# Patient Record
Sex: Male | Born: 1980 | Race: White | Hispanic: No | Marital: Single | Smoking: Current every day smoker
Health system: Southern US, Community
[De-identification: ages and names within clinical notes are randomized; demographics above are authoritative.]

---

## 2001-04-23 ENCOUNTER — Emergency Department (HOSPITAL_COMMUNITY): Admission: EM | Admit: 2001-04-23 | Discharge: 2001-04-23 | Payer: Self-pay | Admitting: Emergency Medicine

## 2001-04-23 ENCOUNTER — Encounter: Payer: Self-pay | Admitting: Emergency Medicine

## 2010-04-26 ENCOUNTER — Emergency Department (HOSPITAL_COMMUNITY): Admission: EM | Admit: 2010-04-26 | Discharge: 2010-04-26 | Payer: Self-pay | Admitting: Emergency Medicine

## 2017-03-27 ENCOUNTER — Emergency Department (HOSPITAL_COMMUNITY): Payer: Self-pay

## 2017-03-27 ENCOUNTER — Encounter (HOSPITAL_COMMUNITY): Payer: Self-pay | Admitting: Emergency Medicine

## 2017-03-27 ENCOUNTER — Inpatient Hospital Stay (HOSPITAL_COMMUNITY)
Admission: EM | Admit: 2017-03-27 | Discharge: 2017-04-15 | DRG: 082 | Disposition: E | Payer: Self-pay | Attending: General Surgery | Admitting: General Surgery

## 2017-03-27 DIAGNOSIS — Z9911 Dependence on respirator [ventilator] status: Secondary | ICD-10-CM

## 2017-03-27 DIAGNOSIS — Z4659 Encounter for fitting and adjustment of other gastrointestinal appliance and device: Secondary | ICD-10-CM

## 2017-03-27 DIAGNOSIS — E876 Hypokalemia: Secondary | ICD-10-CM | POA: Diagnosis present

## 2017-03-27 DIAGNOSIS — S0193XA Puncture wound without foreign body of unspecified part of head, initial encounter: Secondary | ICD-10-CM

## 2017-03-27 DIAGNOSIS — Z66 Do not resuscitate: Secondary | ICD-10-CM | POA: Diagnosis present

## 2017-03-27 DIAGNOSIS — F17200 Nicotine dependence, unspecified, uncomplicated: Secondary | ICD-10-CM | POA: Diagnosis present

## 2017-03-27 DIAGNOSIS — Z23 Encounter for immunization: Secondary | ICD-10-CM

## 2017-03-27 DIAGNOSIS — I959 Hypotension, unspecified: Secondary | ICD-10-CM | POA: Diagnosis present

## 2017-03-27 DIAGNOSIS — T426X5A Adverse effect of other antiepileptic and sedative-hypnotic drugs, initial encounter: Secondary | ICD-10-CM | POA: Diagnosis present

## 2017-03-27 DIAGNOSIS — E870 Hyperosmolality and hypernatremia: Secondary | ICD-10-CM | POA: Diagnosis present

## 2017-03-27 DIAGNOSIS — J189 Pneumonia, unspecified organism: Secondary | ICD-10-CM

## 2017-03-27 DIAGNOSIS — S069X9A Unspecified intracranial injury with loss of consciousness of unspecified duration, initial encounter: Secondary | ICD-10-CM

## 2017-03-27 DIAGNOSIS — Z515 Encounter for palliative care: Secondary | ICD-10-CM | POA: Diagnosis present

## 2017-03-27 DIAGNOSIS — W3400XA Accidental discharge from unspecified firearms or gun, initial encounter: Secondary | ICD-10-CM

## 2017-03-27 DIAGNOSIS — R40243 Glasgow coma scale score 3-8, unspecified time: Secondary | ICD-10-CM | POA: Diagnosis present

## 2017-03-27 DIAGNOSIS — R000 Tachycardia, unspecified: Secondary | ICD-10-CM | POA: Diagnosis present

## 2017-03-27 DIAGNOSIS — N179 Acute kidney failure, unspecified: Secondary | ICD-10-CM | POA: Diagnosis not present

## 2017-03-27 DIAGNOSIS — J9601 Acute respiratory failure with hypoxia: Secondary | ICD-10-CM | POA: Diagnosis present

## 2017-03-27 DIAGNOSIS — J9690 Respiratory failure, unspecified, unspecified whether with hypoxia or hypercapnia: Secondary | ICD-10-CM

## 2017-03-27 DIAGNOSIS — D62 Acute posthemorrhagic anemia: Secondary | ICD-10-CM | POA: Diagnosis present

## 2017-03-27 DIAGNOSIS — E232 Diabetes insipidus: Secondary | ICD-10-CM | POA: Diagnosis present

## 2017-03-27 DIAGNOSIS — R0902 Hypoxemia: Secondary | ICD-10-CM

## 2017-03-27 DIAGNOSIS — J9811 Atelectasis: Secondary | ICD-10-CM | POA: Diagnosis present

## 2017-03-27 DIAGNOSIS — R739 Hyperglycemia, unspecified: Secondary | ICD-10-CM | POA: Diagnosis present

## 2017-03-27 DIAGNOSIS — G936 Cerebral edema: Secondary | ICD-10-CM | POA: Diagnosis present

## 2017-03-27 DIAGNOSIS — Z638 Other specified problems related to primary support group: Secondary | ICD-10-CM

## 2017-03-27 DIAGNOSIS — J398 Other specified diseases of upper respiratory tract: Secondary | ICD-10-CM

## 2017-03-27 DIAGNOSIS — S0292XA Unspecified fracture of facial bones, initial encounter for closed fracture: Secondary | ICD-10-CM | POA: Diagnosis present

## 2017-03-27 DIAGNOSIS — Y249XXA Unspecified firearm discharge, undetermined intent, initial encounter: Secondary | ICD-10-CM

## 2017-03-27 DIAGNOSIS — R001 Bradycardia, unspecified: Secondary | ICD-10-CM | POA: Diagnosis present

## 2017-03-27 DIAGNOSIS — Z880 Allergy status to penicillin: Secondary | ICD-10-CM

## 2017-03-27 DIAGNOSIS — X749XXA Intentional self-harm by unspecified firearm discharge, initial encounter: Secondary | ICD-10-CM

## 2017-03-27 DIAGNOSIS — Z452 Encounter for adjustment and management of vascular access device: Secondary | ICD-10-CM

## 2017-03-27 DIAGNOSIS — R609 Edema, unspecified: Secondary | ICD-10-CM | POA: Diagnosis present

## 2017-03-27 LAB — BPAM RBC
BLOOD PRODUCT EXPIRATION DATE: 201810182359
Blood Product Expiration Date: 201810182359
ISSUE DATE / TIME: 201810130055
ISSUE DATE / TIME: 201810130829
Unit Type and Rh: 9500
Unit Type and Rh: 9500

## 2017-03-27 LAB — BPAM FFP
BLOOD PRODUCT EXPIRATION DATE: 201810272359
BLOOD PRODUCT EXPIRATION DATE: 201810272359
ISSUE DATE / TIME: 201810130055
ISSUE DATE / TIME: 201810130055
Unit Type and Rh: 6200
Unit Type and Rh: 6200

## 2017-03-27 LAB — COMPREHENSIVE METABOLIC PANEL
ALT: 21 U/L (ref 17–63)
AST: 36 U/L (ref 15–41)
Albumin: 3 g/dL — ABNORMAL LOW (ref 3.5–5.0)
Alkaline Phosphatase: 48 U/L (ref 38–126)
Anion gap: 10 (ref 5–15)
BUN: 19 mg/dL (ref 6–20)
CHLORIDE: 108 mmol/L (ref 101–111)
CO2: 19 mmol/L — AB (ref 22–32)
CREATININE: 1.47 mg/dL — AB (ref 0.61–1.24)
Calcium: 8.2 mg/dL — ABNORMAL LOW (ref 8.9–10.3)
GFR calc Af Amer: >60 mL/min (ref >60)
GFR, EST NON AFRICAN AMERICAN: 59 mL/min — AB (ref >60)
GLUCOSE: 250 mg/dL — AB (ref 65–99)
Potassium: 3.6 mmol/L (ref 3.5–5.1)
SODIUM: 137 mmol/L (ref 135–145)
Total Bilirubin: 0.5 mg/dL (ref 0.3–1.2)
Total Protein: 5.2 g/dL — ABNORMAL LOW (ref 6.5–8.1)

## 2017-03-27 LAB — URINALYSIS, ROUTINE W REFLEX MICROSCOPIC
Bacteria, UA: NONE SEEN
Bilirubin Urine: NEGATIVE
GLUCOSE, UA: 150 mg/dL — AB
HGB URINE DIPSTICK: NEGATIVE
Ketones, ur: NEGATIVE mg/dL
Leukocytes, UA: NEGATIVE
NITRITE: NEGATIVE
PH: 6 (ref 5.0–8.0)
PROTEIN: NEGATIVE mg/dL
RBC / HPF: NONE SEEN RBC/hpf (ref 0–5)
SPECIFIC GRAVITY, URINE: 1.009 (ref 1.005–1.030)
Squamous Epithelial / LPF: NONE SEEN

## 2017-03-27 LAB — PREPARE FRESH FROZEN PLASMA
UNIT DIVISION: 0
Unit division: 0

## 2017-03-27 LAB — I-STAT CHEM 8, ED
BUN: 21 mg/dL — ABNORMAL HIGH (ref 6–20)
Calcium, Ion: 1 mmol/L — ABNORMAL LOW (ref 1.15–1.40)
Chloride: 107 mmol/L (ref 101–111)
Creatinine, Ser: 1.3 mg/dL — ABNORMAL HIGH (ref 0.61–1.24)
Glucose, Bld: 244 mg/dL — ABNORMAL HIGH (ref 65–99)
HEMATOCRIT: 38 % — AB (ref 39.0–52.0)
HEMOGLOBIN: 12.9 g/dL — AB (ref 13.0–17.0)
POTASSIUM: 3.4 mmol/L — AB (ref 3.5–5.1)
Sodium: 140 mmol/L (ref 135–145)
TCO2: 20 mmol/L — ABNORMAL LOW (ref 22–32)

## 2017-03-27 LAB — TYPE AND SCREEN
ABO/RH(D): A POS
Antibody Screen: NEGATIVE
UNIT DIVISION: 0
Unit division: 0

## 2017-03-27 LAB — CBC
HCT: 38.6 % — ABNORMAL LOW (ref 39.0–52.0)
HEMATOCRIT: 37.1 % — AB (ref 39.0–52.0)
HEMATOCRIT: 28.2 % — AB (ref 39.0–52.0)
Hemoglobin: 12.6 g/dL — ABNORMAL LOW (ref 13.0–17.0)
Hemoglobin: 12.1 g/dL — ABNORMAL LOW (ref 13.0–17.0)
Hemoglobin: 9.2 g/dL — ABNORMAL LOW (ref 13.0–17.0)
MCH: 28.8 pg (ref 26.0–34.0)
MCH: 28.2 pg (ref 26.0–34.0)
MCH: 28.5 pg (ref 26.0–34.0)
MCHC: 32.6 g/dL (ref 30.0–36.0)
MCHC: 32.6 g/dL (ref 30.0–36.0)
MCHC: 32.6 g/dL (ref 30.0–36.0)
MCV: 88.1 fL (ref 78.0–100.0)
MCV: 86.5 fL (ref 78.0–100.0)
MCV: 87.3 fL (ref 78.0–100.0)
PLATELETS: 385 10*3/uL (ref 150–400)
Platelets: 358 10*3/uL (ref 150–400)
Platelets: 196 10*3/uL (ref 150–400)
RBC: 4.38 MIL/uL (ref 4.22–5.81)
RBC: 4.29 MIL/uL (ref 4.22–5.81)
RBC: 3.23 MIL/uL — ABNORMAL LOW (ref 4.22–5.81)
RDW: 13.4 % (ref 11.5–15.5)
RDW: 13.6 % (ref 11.5–15.5)
RDW: 13.9 % (ref 11.5–15.5)
WBC: 26.2 10*3/uL — ABNORMAL HIGH (ref 4.0–10.5)
WBC: 31.1 10*3/uL — AB (ref 4.0–10.5)
WBC: 19.5 10*3/uL — ABNORMAL HIGH (ref 4.0–10.5)

## 2017-03-27 LAB — PROTIME-INR
INR: 1.17
PROTHROMBIN TIME: 14.8 s (ref 11.4–15.2)

## 2017-03-27 LAB — I-STAT CG4 LACTIC ACID, ED: Lactic Acid, Venous: 4.75 mmol/L (ref 0.5–1.9)

## 2017-03-27 LAB — HEMOGLOBIN A1C
Hgb A1c MFr Bld: 5.5 % (ref 4.8–5.6)
Mean Plasma Glucose: 111.15 mg/dL

## 2017-03-27 LAB — LACTIC ACID, PLASMA: Lactic Acid, Venous: 3.7 mmol/L (ref 0.5–1.9)

## 2017-03-27 LAB — SODIUM
SODIUM: 161 mmol/L — AB (ref 135–145)
Sodium: 144 mmol/L (ref 135–145)

## 2017-03-27 LAB — CDS SEROLOGY

## 2017-03-27 LAB — GLUCOSE, CAPILLARY
GLUCOSE-CAPILLARY: 145 mg/dL — AB (ref 65–99)
Glucose-Capillary: 122 mg/dL — ABNORMAL HIGH (ref 65–99)
Glucose-Capillary: 130 mg/dL — ABNORMAL HIGH (ref 65–99)
Glucose-Capillary: 167 mg/dL — ABNORMAL HIGH (ref 65–99)

## 2017-03-27 LAB — ABO/RH: ABO/RH(D): A POS

## 2017-03-27 LAB — ETHANOL

## 2017-03-27 MED ORDER — SODIUM CHLORIDE 0.9 % IV BOLUS (SEPSIS)
1000.0000 mL | Freq: Once | INTRAVENOUS | Status: AC
  Administered 2017-03-27: 1000 mL via INTRAVENOUS

## 2017-03-27 MED ORDER — PNEUMOCOCCAL VAC POLYVALENT 25 MCG/0.5ML IJ INJ
0.5000 mL | INJECTION | INTRAMUSCULAR | Status: AC
  Administered 2017-03-29: 0.5 mL via INTRAMUSCULAR
  Filled 2017-03-27: qty 0.5

## 2017-03-27 MED ORDER — SODIUM CHLORIDE 0.9 % IV SOLN
INTRAVENOUS | Status: AC | PRN
  Administered 2017-03-27 (×2): 1000 mL via INTRAVENOUS

## 2017-03-27 MED ORDER — FENTANYL 2500MCG IN NS 250ML (10MCG/ML) PREMIX INFUSION
25.0000 ug/h | INTRAVENOUS | Status: DC
  Administered 2017-03-27: 25 ug/h via INTRAVENOUS
  Filled 2017-03-27: qty 250

## 2017-03-27 MED ORDER — CHLORHEXIDINE GLUCONATE 0.12% ORAL RINSE (MEDLINE KIT)
15.0000 mL | Freq: Two times a day (BID) | OROMUCOSAL | Status: DC
  Administered 2017-03-27 – 2017-04-03 (×15): 15 mL via OROMUCOSAL

## 2017-03-27 MED ORDER — DEXMEDETOMIDINE HCL IN NACL 200 MCG/50ML IV SOLN
0.4000 ug/kg/h | INTRAVENOUS | Status: DC
  Administered 2017-03-27: 0.4 ug/kg/h via INTRAVENOUS
  Administered 2017-03-27: 0.2 ug/kg/h via INTRAVENOUS
  Administered 2017-03-28: 0.5 ug/kg/h via INTRAVENOUS
  Administered 2017-03-28 (×2): 0.4 ug/kg/h via INTRAVENOUS
  Filled 2017-03-27 (×6): qty 50

## 2017-03-27 MED ORDER — MIDAZOLAM HCL 2 MG/2ML IJ SOLN
2.0000 mg | INTRAMUSCULAR | Status: DC | PRN
  Administered 2017-03-27: 1 mg via INTRAVENOUS
  Administered 2017-03-27 – 2017-04-03 (×22): 2 mg via INTRAVENOUS
  Filled 2017-03-27 (×26): qty 2

## 2017-03-27 MED ORDER — ETOMIDATE 2 MG/ML IV SOLN
INTRAVENOUS | Status: AC | PRN
  Administered 2017-03-27: 20 mg via INTRAVENOUS

## 2017-03-27 MED ORDER — ACETAMINOPHEN 650 MG RE SUPP
650.0000 mg | Freq: Four times a day (QID) | RECTAL | Status: DC | PRN
  Administered 2017-03-27 (×2): 650 mg via RECTAL
  Filled 2017-03-27 (×2): qty 1

## 2017-03-27 MED ORDER — PANTOPRAZOLE SODIUM 40 MG PO TBEC
40.0000 mg | DELAYED_RELEASE_TABLET | Freq: Every day | ORAL | Status: DC
  Administered 2017-03-31: 40 mg via ORAL
  Filled 2017-03-27 (×2): qty 1

## 2017-03-27 MED ORDER — SODIUM CHLORIDE 0.9 % IV SOLN
0.0000 ug/min | INTRAVENOUS | Status: DC
  Administered 2017-03-28 (×2): 160 ug/min via INTRAVENOUS
  Administered 2017-03-28: 50 ug/min via INTRAVENOUS
  Administered 2017-03-28: 20 ug/min via INTRAVENOUS
  Filled 2017-03-27 (×4): qty 1

## 2017-03-27 MED ORDER — FENTANYL BOLUS VIA INFUSION
50.0000 ug | INTRAVENOUS | Status: DC | PRN
  Filled 2017-03-27: qty 50

## 2017-03-27 MED ORDER — ACETAMINOPHEN 160 MG/5ML PO SOLN
650.0000 mg | Freq: Four times a day (QID) | ORAL | Status: DC | PRN
Start: 2017-03-27 — End: 2017-04-03
  Administered 2017-03-31 – 2017-04-01 (×3): 650 mg
  Filled 2017-03-27 (×3): qty 20.3

## 2017-03-27 MED ORDER — ONDANSETRON HCL 4 MG/2ML IJ SOLN
4.0000 mg | Freq: Four times a day (QID) | INTRAMUSCULAR | Status: DC | PRN
  Administered 2017-04-02: 4 mg via INTRAVENOUS
  Filled 2017-03-27: qty 2

## 2017-03-27 MED ORDER — SODIUM CHLORIDE 0.9 % IV SOLN
INTRAVENOUS | Status: DC
  Administered 2017-03-27 – 2017-03-28 (×5): via INTRAVENOUS

## 2017-03-27 MED ORDER — ORAL CARE MOUTH RINSE
15.0000 mL | OROMUCOSAL | Status: DC
  Administered 2017-03-27 – 2017-04-03 (×76): 15 mL via OROMUCOSAL

## 2017-03-27 MED ORDER — DOCUSATE SODIUM 50 MG/5ML PO LIQD
100.0000 mg | Freq: Two times a day (BID) | ORAL | Status: DC | PRN
Start: 2017-03-27 — End: 2017-04-03
  Administered 2017-03-30 – 2017-04-03 (×4): 100 mg
  Filled 2017-03-27 (×4): qty 10

## 2017-03-27 MED ORDER — ORAL CARE MOUTH RINSE: 15.0000 mL | Freq: Four times a day (QID) | OROMUCOSAL | Status: DC

## 2017-03-27 MED ORDER — SODIUM CHLORIDE 3 % IV SOLN
INTRAVENOUS | Status: DC
  Administered 2017-03-27: 10 mL/h via INTRAVENOUS
  Filled 2017-03-27 (×4): qty 500

## 2017-03-27 MED ORDER — INSULIN ASPART 100 UNIT/ML ~~LOC~~ SOLN
0.0000 [IU] | SUBCUTANEOUS | Status: DC
  Administered 2017-03-27: 2 [IU] via SUBCUTANEOUS
  Administered 2017-03-27: 3 [IU] via SUBCUTANEOUS
  Administered 2017-03-27: 2 [IU] via SUBCUTANEOUS
  Administered 2017-03-28: 3 [IU] via SUBCUTANEOUS
  Administered 2017-03-28 (×3): 2 [IU] via SUBCUTANEOUS
  Administered 2017-03-29: 3 [IU] via SUBCUTANEOUS
  Administered 2017-03-29: 2 [IU] via SUBCUTANEOUS
  Administered 2017-03-29: 5 [IU] via SUBCUTANEOUS
  Administered 2017-03-29 (×2): 2 [IU] via SUBCUTANEOUS
  Administered 2017-03-29: 8 [IU] via SUBCUTANEOUS
  Administered 2017-03-30: 5 [IU] via SUBCUTANEOUS
  Administered 2017-03-30: 3 [IU] via SUBCUTANEOUS
  Administered 2017-03-30 (×2): 5 [IU] via SUBCUTANEOUS
  Administered 2017-03-30: 3 [IU] via SUBCUTANEOUS
  Administered 2017-03-31 (×2): 5 [IU] via SUBCUTANEOUS
  Administered 2017-03-31 (×2): 3 [IU] via SUBCUTANEOUS
  Administered 2017-03-31: 5 [IU] via SUBCUTANEOUS
  Administered 2017-03-31 – 2017-04-01 (×3): 3 [IU] via SUBCUTANEOUS
  Administered 2017-04-01: 5 [IU] via SUBCUTANEOUS
  Administered 2017-04-01: 3 [IU] via SUBCUTANEOUS
  Administered 2017-04-01: 5 [IU] via SUBCUTANEOUS
  Administered 2017-04-01: 3 [IU] via SUBCUTANEOUS
  Administered 2017-04-02: 2 [IU] via SUBCUTANEOUS
  Administered 2017-04-02: 5 [IU] via SUBCUTANEOUS
  Administered 2017-04-02: 2 [IU] via SUBCUTANEOUS
  Administered 2017-04-02: 5 [IU] via SUBCUTANEOUS
  Administered 2017-04-02: 3 [IU] via SUBCUTANEOUS
  Administered 2017-04-02: 5 [IU] via SUBCUTANEOUS
  Administered 2017-04-02 – 2017-04-03 (×3): 3 [IU] via SUBCUTANEOUS
  Administered 2017-04-03: 2 [IU] via SUBCUTANEOUS

## 2017-03-27 MED ORDER — ONDANSETRON 4 MG PO TBDP: 4.0000 mg | ORAL_TABLET | Freq: Four times a day (QID) | ORAL | Status: DC | PRN

## 2017-03-27 MED ORDER — FENTANYL CITRATE (PF) 100 MCG/2ML IJ SOLN
50.0000 ug | INTRAMUSCULAR | Status: DC | PRN
  Administered 2017-03-27 (×2): 50 ug via INTRAVENOUS
  Administered 2017-03-27: 25 ug via INTRAVENOUS
  Filled 2017-03-27 (×3): qty 2

## 2017-03-27 MED ORDER — SUCCINYLCHOLINE CHLORIDE 20 MG/ML IJ SOLN
INTRAMUSCULAR | Status: AC | PRN
  Administered 2017-03-27: 120 mg via INTRAVENOUS

## 2017-03-27 MED ORDER — SODIUM CHLORIDE 0.9 % IV SOLN
Freq: Once | INTRAVENOUS | Status: AC
  Administered 2017-03-27: 05:00:00 via INTRAVENOUS

## 2017-03-27 MED ORDER — FENTANYL CITRATE (PF) 100 MCG/2ML IJ SOLN
50.0000 ug | Freq: Once | INTRAMUSCULAR | Status: AC
  Administered 2017-03-27: 50 ug via INTRAVENOUS

## 2017-03-27 MED ORDER — PANTOPRAZOLE SODIUM 40 MG IV SOLR
40.0000 mg | Freq: Every day | INTRAVENOUS | Status: DC
  Administered 2017-03-27 – 2017-04-03 (×7): 40 mg via INTRAVENOUS
  Filled 2017-03-27 (×8): qty 40

## 2017-03-27 NOTE — ED Notes (Signed)
Patient is stable and ready to be transport to the floor at this time.  Report was called to 4N RN.  Belongings taken with the patient to the floor.   
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
## 2017-03-27 NOTE — ED Notes (Signed)
Patient to CT with RN, EMT, RT and Trauma Surgeon.
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
## 2017-03-27 NOTE — ED Notes (Signed)
Patient returned from CT
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
## 2017-03-27 NOTE — ED Notes (Signed)
OG advanced per XRAY recommendation.  Draining bloody secretions

## 2017-03-27 NOTE — Progress Notes (Signed)
Patient transported from ED Trauma A to CT and back with no complications. 
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
## 2017-03-28 ENCOUNTER — Inpatient Hospital Stay (HOSPITAL_COMMUNITY): Payer: Self-pay

## 2017-03-28 LAB — SODIUM
SODIUM: 176 mmol/L — AB (ref 135–145)
Sodium: 169 mmol/L (ref 135–145)
Sodium: 171 mmol/L (ref 135–145)

## 2017-03-28 LAB — BASIC METABOLIC PANEL
BUN: 17 mg/dL (ref 6–20)
CO2: 22 mmol/L (ref 22–32)
CREATININE: 1.91 mg/dL — AB (ref 0.61–1.24)
Calcium: 9.6 mg/dL (ref 8.9–10.3)
GFR calc non Af Amer: 43 mL/min — ABNORMAL LOW (ref >60)
GFR, EST AFRICAN AMERICAN: 50 mL/min — AB (ref >60)
GLUCOSE: 133 mg/dL — AB (ref 65–99)
Potassium: 3.6 mmol/L (ref 3.5–5.1)
Sodium: 174 mmol/L (ref 135–145)

## 2017-03-28 LAB — BLOOD GAS, ARTERIAL
ACID-BASE DEFICIT: 5.1 mmol/L — AB (ref 0.0–2.0)
BICARBONATE: 18.9 mmol/L — AB (ref 20.0–28.0)
DRAWN BY: 51133
FIO2: 40
LHR: 16 {breaths}/min
MECHVT: 620 mL
O2 Saturation: 96 %
PATIENT TEMPERATURE: 98.6
PCO2 ART: 31.3 mmHg — AB (ref 32.0–48.0)
PEEP/CPAP: 5 cmH2O
PO2 ART: 82.2 mmHg — AB (ref 83.0–108.0)
pH, Arterial: 7.398 (ref 7.350–7.450)

## 2017-03-28 LAB — CBC
HEMATOCRIT: 34.7 % — AB (ref 39.0–52.0)
HEMOGLOBIN: 11 g/dL — AB (ref 13.0–17.0)
MCH: 28.1 pg (ref 26.0–34.0)
MCHC: 31.7 g/dL (ref 30.0–36.0)
MCV: 88.7 fL (ref 78.0–100.0)
Platelets: 179 10*3/uL (ref 150–400)
RBC: 3.91 MIL/uL — ABNORMAL LOW (ref 4.22–5.81)
RDW: 14.6 % (ref 11.5–15.5)
WBC: 27.9 10*3/uL — ABNORMAL HIGH (ref 4.0–10.5)

## 2017-03-28 LAB — GLUCOSE, CAPILLARY
GLUCOSE-CAPILLARY: 161 mg/dL — AB (ref 65–99)
Glucose-Capillary: 114 mg/dL — ABNORMAL HIGH (ref 65–99)
Glucose-Capillary: 116 mg/dL — ABNORMAL HIGH (ref 65–99)
Glucose-Capillary: 119 mg/dL — ABNORMAL HIGH (ref 65–99)
Glucose-Capillary: 121 mg/dL — ABNORMAL HIGH (ref 65–99)
Glucose-Capillary: 133 mg/dL — ABNORMAL HIGH (ref 65–99)

## 2017-03-28 LAB — HIV ANTIBODY (ROUTINE TESTING W REFLEX): HIV Screen 4th Generation wRfx: NONREACTIVE

## 2017-03-28 LAB — TSH: TSH: 0.185 u[IU]/mL — AB (ref 0.350–4.500)

## 2017-03-28 LAB — PROTIME-INR
INR: 1.36
Prothrombin Time: 16.7 seconds — ABNORMAL HIGH (ref 11.4–15.2)

## 2017-03-28 LAB — LACTIC ACID, PLASMA: LACTIC ACID, VENOUS: 1.9 mmol/L (ref 0.5–1.9)

## 2017-03-28 LAB — CORTISOL-AM, BLOOD: Cortisol - AM: 30.6 ug/dL — ABNORMAL HIGH (ref 6.7–22.6)

## 2017-03-28 LAB — BLOOD PRODUCT ORDER (VERBAL) VERIFICATION

## 2017-03-28 MED ORDER — DEXTROSE 5 % IV SOLN
INTRAVENOUS | Status: DC
  Administered 2017-03-28 – 2017-04-01 (×8): via INTRAVENOUS
  Filled 2017-03-28: qty 1000

## 2017-03-28 MED ORDER — CHLORHEXIDINE GLUCONATE CLOTH 2 % EX PADS
6.0000 | MEDICATED_PAD | CUTANEOUS | Status: AC
  Administered 2017-03-29: 6 via TOPICAL

## 2017-03-28 MED ORDER — DEXAMETHASONE SODIUM PHOSPHATE 10 MG/ML IJ SOLN
10.0000 mg | Freq: Two times a day (BID) | INTRAMUSCULAR | Status: DC
  Administered 2017-03-28 – 2017-04-01 (×9): 10 mg via INTRAVENOUS
  Filled 2017-03-28 (×9): qty 1

## 2017-03-28 MED ORDER — PHENYLEPHRINE HCL 10 MG/ML IJ SOLN
0.0000 ug/min | INTRAMUSCULAR | Status: DC
  Administered 2017-03-28: 150 ug/min via INTRAVENOUS
  Administered 2017-03-29: 120 ug/min via INTRAVENOUS
  Administered 2017-03-29: 90 ug/min via INTRAVENOUS
  Filled 2017-03-28 (×3): qty 4

## 2017-03-28 MED ORDER — DESMOPRESSIN ACETATE 4 MCG/ML IJ SOLN
1.0000 ug | Freq: Four times a day (QID) | INTRAMUSCULAR | Status: DC | PRN
  Administered 2017-03-29: 1 ug via INTRAVENOUS
  Filled 2017-03-28: qty 0.25
  Filled 2017-03-28 (×5): qty 1
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
## 2017-03-28 NOTE — Progress Notes (Signed)
Patient started on SBT with documented settings. Patient tolerating well, RN notified RCP will continue to monitor.

## 2017-03-28 NOTE — Progress Notes (Signed)
Critical Na 169 discussed with Dr. Yetta Barre.  Urine specific gravity of 1.0022.  Orders given.  Will continue to monitor.
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
## 2017-03-28 NOTE — Progress Notes (Signed)
Critical Na 161.  Dr. Andrey Campanile notified.  3% Na gtt paused, will recheck with AM labs.  Pt BP 85/66 MAP 74, neo ordered, but MD okay with BP for now.  Will continue to monitor and update
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
## 2017-03-28 NOTE — Procedures (Signed)
Arterial Catheter Insertion Procedure Note DAVONN FLANERY [id number] May 28, 1981  Procedure: Insertion of Arterial Catheter  Indications: Blood pressure monitoring  Procedure Details Consent: Risks of procedure as well as the alternatives and risks of each were explained to the (patient/caregiver).  Consent for procedure obtained. Time Out: Verified patient identification, verified procedure, site/side was marked, verified correct patient position, special equipment/implants available, medications/allergies/relevent history reviewed, required imaging and test results available.  Performed  Maximum sterile technique was used including antiseptics, cap, gloves, gown, hand hygiene, mask and sheet. Skin prep: Chlorhexidine; local anesthetic administered 20 gauge catheter was inserted into left radial artery using the Seldinger technique.  Evaluation Blood flow good; BP tracing good. Complications: No apparent complications.   Newt Lukes 03/28/2017

## 2017-03-29 ENCOUNTER — Inpatient Hospital Stay (HOSPITAL_COMMUNITY): Payer: Self-pay

## 2017-03-29 LAB — CBC
HCT: 34.1 % — ABNORMAL LOW (ref 39.0–52.0)
Hemoglobin: 10.9 g/dL — ABNORMAL LOW (ref 13.0–17.0)
MCH: 28.1 pg (ref 26.0–34.0)
MCHC: 32 g/dL (ref 30.0–36.0)
MCV: 87.9 fL (ref 78.0–100.0)
PLATELETS: 174 10*3/uL (ref 150–400)
RBC: 3.88 MIL/uL — ABNORMAL LOW (ref 4.22–5.81)
RDW: 14.9 % (ref 11.5–15.5)
WBC: 16.3 10*3/uL — ABNORMAL HIGH (ref 4.0–10.5)

## 2017-03-29 LAB — BLOOD GAS, ARTERIAL
ACID-BASE DEFICIT: 4.8 mmol/L — AB (ref 0.0–2.0)
BICARBONATE: 19.3 mmol/L — AB (ref 20.0–28.0)
Delivery systems: POSITIVE
Drawn by: 331761
EXPIRATORY PAP: 5
FIO2: 40
Inspiratory PAP: 10
Mode: POSITIVE
O2 Saturation: 95.7 %
PCO2 ART: 34.9 mmHg (ref 32.0–48.0)
PH ART: 7.369 (ref 7.350–7.450)
PO2 ART: 84.4 mmHg (ref 83.0–108.0)
Patient temperature: 101.1

## 2017-03-29 LAB — SODIUM
SODIUM: 176 mmol/L — AB (ref 135–145)
SODIUM: 172 mmol/L — AB (ref 135–145)
Sodium: 178 mmol/L (ref 135–145)

## 2017-03-29 LAB — GLUCOSE, CAPILLARY
GLUCOSE-CAPILLARY: 136 mg/dL — AB (ref 65–99)
GLUCOSE-CAPILLARY: 158 mg/dL — AB (ref 65–99)
Glucose-Capillary: 143 mg/dL — ABNORMAL HIGH (ref 65–99)
Glucose-Capillary: 147 mg/dL — ABNORMAL HIGH (ref 65–99)
Glucose-Capillary: 217 mg/dL — ABNORMAL HIGH (ref 65–99)
Glucose-Capillary: 260 mg/dL — ABNORMAL HIGH (ref 65–99)

## 2017-03-29 LAB — MAGNESIUM
MAGNESIUM: 2.3 mg/dL (ref 1.7–2.4)
MAGNESIUM: 2.1 mg/dL (ref 1.7–2.4)

## 2017-03-29 LAB — PHOSPHORUS
PHOSPHORUS: 1.7 mg/dL — AB (ref 2.5–4.6)
Phosphorus: <1 mg/dL — CL (ref 2.5–4.6)

## 2017-03-29 LAB — MRSA PCR SCREENING: MRSA by PCR: NEGATIVE

## 2017-03-29 MED ORDER — SODIUM CHLORIDE 0.9 % IV SOLN
INTRAVENOUS | Status: DC | PRN
  Administered 2017-03-30: 10:00:00 via INTRA_ARTERIAL

## 2017-03-29 MED ORDER — FREE WATER
200.0000 mL | Freq: Four times a day (QID) | Status: DC
  Administered 2017-03-29 – 2017-03-30 (×5): 200 mL

## 2017-03-29 MED ORDER — PIVOT 1.5 CAL PO LIQD
1000.0000 mL | ORAL | Status: DC
  Administered 2017-03-29 – 2017-04-03 (×3): 1000 mL

## 2017-03-29 MED ORDER — VITAL HIGH PROTEIN PO LIQD
1000.0000 mL | ORAL | Status: AC
  Administered 2017-03-29: 1000 mL

## 2017-03-29 MED ORDER — PRO-STAT SUGAR FREE PO LIQD
30.0000 mL | Freq: Two times a day (BID) | ORAL | Status: DC
  Administered 2017-03-29: 30 mL
  Administered 2017-03-29: 21:00:00
  Administered 2017-03-30 – 2017-04-03 (×8): 30 mL
  Filled 2017-03-29 (×10): qty 30

## 2017-03-29 MED ORDER — POTASSIUM PHOSPHATES 15 MMOLE/5ML IV SOLN
30.0000 mmol | Freq: Once | INTRAVENOUS | Status: AC
  Administered 2017-03-29: 30 mmol via INTRAVENOUS
  Filled 2017-03-29: qty 10

## 2017-03-29 MED ORDER — FENTANYL CITRATE (PF) 100 MCG/2ML IJ SOLN
25.0000 ug | INTRAMUSCULAR | Status: DC | PRN
  Administered 2017-03-29 – 2017-04-03 (×32): 50 ug via INTRAVENOUS
  Filled 2017-03-29 (×32): qty 2

## 2017-03-29 NOTE — Progress Notes (Signed)
Patient ID: Leon Zhang, male   DOB: 03-Aug-1980, 36 y.o.   MRN: [id number] CT reviewed - agree with the report, significant bifrontal hypodensities, I still rec no early surgery, is this survivable? He remains semi-purposeful with his reaction to stimuli, Na 176 but UOP not huge and spec grav 1.009. Am cortisol was normal yesterday.
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
## 2017-03-29 NOTE — Plan of Care (Signed)
Problem: Nutritional: Goal: Intake of prescribed amount of daily calories will improve Outcome: Completed/Met Date Met: 03/29/17 Started tube feed, pivot 1.5

## 2017-03-29 NOTE — Progress Notes (Signed)
CRITICAL VALUE ALERT  Critical Value:  Phosphorus, less than 1   Date & Time Notied:  03/29/17, 1355  Provider Notified: Jimmye Norman  Orders Received/Actions taken: MD will notify pharmacy for replacement

## 2017-03-29 NOTE — Progress Notes (Signed)
RT transported pt to and from CT without event. RT will continue to monitor. 

## 2017-03-29 NOTE — Procedures (Signed)
Arterial Catheter Insertion Procedure Note ZIAD MAYE [id number] 02-01-1981  Procedure: Insertion of Arterial Catheter  Indications: Blood pressure monitoring and Frequent blood sampling  Procedure Details Consent: Unable to obtain consent because of altered level of consciousness. Patient intubated on ventilator.  Time Out: Verified patient identification, verified procedure, site/side was marked, verified correct patient position, special equipment/implants available, medications/allergies/relevent history reviewed, required imaging and test results available.  Performed  Maximum sterile technique was used including cap, gloves, gown, hand hygiene, mask and sheet. Skin prep: Chlorhexidine; local anesthetic administered 22 gauge catheter was inserted into left radial artery using the Seldinger technique.  Evaluation Blood flow good; BP tracing good. Complications: No apparent complications.   Carlynn Spry 03/29/2017

## 2017-03-29 NOTE — Progress Notes (Signed)
CRITICAL VALUE ALERT  Critical Value:  Na 178  Date & Time Notied:  03/29/17, 1610  Provider Notified: Violeta Gelinas, MD  Orders Received/Actions taken: Free Water, DDAVP
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
## 2017-03-30 ENCOUNTER — Inpatient Hospital Stay (HOSPITAL_COMMUNITY): Payer: Self-pay

## 2017-03-30 LAB — BLOOD GAS, ARTERIAL
ACID-BASE DEFICIT: 1.6 mmol/L (ref 0.0–2.0)
Bicarbonate: 22 mmol/L (ref 20.0–28.0)
Drawn by: 10006
FIO2: 40
LHR: 16 {breaths}/min
O2 SAT: 99.1 %
PCO2 ART: 33.4 mmHg (ref 32.0–48.0)
PEEP: 5 cmH2O
PH ART: 7.434 (ref 7.350–7.450)
Patient temperature: 98.6
VT: 620 mL
pO2, Arterial: 139 mmHg — ABNORMAL HIGH (ref 83.0–108.0)

## 2017-03-30 LAB — CBC
HEMATOCRIT: 21.7 % — AB (ref 39.0–52.0)
HEMATOCRIT: 21.8 % — AB (ref 39.0–52.0)
HEMOGLOBIN: 7.2 g/dL — AB (ref 13.0–17.0)
HEMOGLOBIN: 7.2 g/dL — AB (ref 13.0–17.0)
MCH: 29 pg (ref 26.0–34.0)
MCH: 28.8 pg (ref 26.0–34.0)
MCHC: 33.2 g/dL (ref 30.0–36.0)
MCHC: 33 g/dL (ref 30.0–36.0)
MCV: 87.5 fL (ref 78.0–100.0)
MCV: 87.2 fL (ref 78.0–100.0)
Platelets: 150 10*3/uL (ref 150–400)
Platelets: 143 10*3/uL — ABNORMAL LOW (ref 150–400)
RBC: 2.48 MIL/uL — AB (ref 4.22–5.81)
RBC: 2.5 MIL/uL — ABNORMAL LOW (ref 4.22–5.81)
RDW: 14.9 % (ref 11.5–15.5)
RDW: 15.1 % (ref 11.5–15.5)
WBC: 12.6 10*3/uL — AB (ref 4.0–10.5)
WBC: 13.8 10*3/uL — ABNORMAL HIGH (ref 4.0–10.5)

## 2017-03-30 LAB — SODIUM
SODIUM: 160 mmol/L — AB (ref 135–145)
Sodium: 167 mmol/L (ref 135–145)
Sodium: 168 mmol/L (ref 135–145)
Sodium: 164 mmol/L (ref 135–145)

## 2017-03-30 LAB — GLUCOSE, CAPILLARY
GLUCOSE-CAPILLARY: 165 mg/dL — AB (ref 65–99)
GLUCOSE-CAPILLARY: 192 mg/dL — AB (ref 65–99)
GLUCOSE-CAPILLARY: 214 mg/dL — AB (ref 65–99)
GLUCOSE-CAPILLARY: 220 mg/dL — AB (ref 65–99)
Glucose-Capillary: 204 mg/dL — ABNORMAL HIGH (ref 65–99)
Glucose-Capillary: 237 mg/dL — ABNORMAL HIGH (ref 65–99)

## 2017-03-30 LAB — PHOSPHORUS
PHOSPHORUS: 2.1 mg/dL — AB (ref 2.5–4.6)
PHOSPHORUS: 3.7 mg/dL (ref 2.5–4.6)

## 2017-03-30 LAB — MAGNESIUM
Magnesium: 2 mg/dL (ref 1.7–2.4)
Magnesium: 1.7 mg/dL (ref 1.7–2.4)

## 2017-03-30 MED ORDER — FREE WATER
300.0000 mL | Freq: Four times a day (QID) | Status: DC
  Administered 2017-03-30 – 2017-04-02 (×11): 300 mL

## 2017-03-30 MED ORDER — SODIUM CHLORIDE 0.9% FLUSH
10.0000 mL | Freq: Two times a day (BID) | INTRAVENOUS | Status: DC
  Administered 2017-03-30 – 2017-04-02 (×7): 10 mL
  Administered 2017-04-02: 30 mL
  Administered 2017-04-03: 10 mL

## 2017-03-30 MED ORDER — SODIUM CHLORIDE 0.9% FLUSH: 10.0000 mL | INTRAVENOUS | Status: DC | PRN

## 2017-03-30 MED ORDER — CHLORHEXIDINE GLUCONATE CLOTH 2 % EX PADS
6.0000 | MEDICATED_PAD | Freq: Every day | CUTANEOUS | Status: DC
  Administered 2017-03-30 – 2017-04-01 (×3): 6 via TOPICAL

## 2017-03-30 MED ORDER — POTASSIUM PHOSPHATES 15 MMOLE/5ML IV SOLN
30.0000 mmol | Freq: Once | INTRAVENOUS | Status: AC
  Administered 2017-03-30: 30 mmol via INTRAVENOUS
  Filled 2017-03-30: qty 10
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
## 2017-03-31 LAB — CBC WITH DIFFERENTIAL/PLATELET
BASOS PCT: 0 %
Basophils Absolute: 0 10*3/uL (ref 0.0–0.1)
Eosinophils Absolute: 0 10*3/uL (ref 0.0–0.7)
Eosinophils Relative: 0 %
HEMATOCRIT: 22.3 % — AB (ref 39.0–52.0)
HEMOGLOBIN: 7.4 g/dL — AB (ref 13.0–17.0)
LYMPHS ABS: 1 10*3/uL (ref 0.7–4.0)
Lymphocytes Relative: 8 %
MCH: 28.5 pg (ref 26.0–34.0)
MCHC: 33.2 g/dL (ref 30.0–36.0)
MCV: 85.8 fL (ref 78.0–100.0)
MONOS PCT: 5 %
Monocytes Absolute: 0.7 10*3/uL (ref 0.1–1.0)
NEUTROS ABS: 11.1 10*3/uL — AB (ref 1.7–7.7)
NEUTROS PCT: 87 %
Platelets: 118 10*3/uL — ABNORMAL LOW (ref 150–400)
RBC: 2.6 MIL/uL — ABNORMAL LOW (ref 4.22–5.81)
RDW: 14.4 % (ref 11.5–15.5)
WBC: 12.8 10*3/uL — ABNORMAL HIGH (ref 4.0–10.5)

## 2017-03-31 LAB — GLUCOSE, CAPILLARY
GLUCOSE-CAPILLARY: 183 mg/dL — AB (ref 65–99)
GLUCOSE-CAPILLARY: 194 mg/dL — AB (ref 65–99)
GLUCOSE-CAPILLARY: 232 mg/dL — AB (ref 65–99)
Glucose-Capillary: 200 mg/dL — ABNORMAL HIGH (ref 65–99)
Glucose-Capillary: 188 mg/dL — ABNORMAL HIGH (ref 65–99)
Glucose-Capillary: 195 mg/dL — ABNORMAL HIGH (ref 65–99)

## 2017-03-31 LAB — BASIC METABOLIC PANEL
ANION GAP: 5 (ref 5–15)
BUN: 31 mg/dL — ABNORMAL HIGH (ref 6–20)
CO2: 25 mmol/L (ref 22–32)
Calcium: 7.8 mg/dL — ABNORMAL LOW (ref 8.9–10.3)
Chloride: 127 mmol/L — ABNORMAL HIGH (ref 101–111)
Creatinine, Ser: 1.83 mg/dL — ABNORMAL HIGH (ref 0.61–1.24)
GFR calc Af Amer: 53 mL/min — ABNORMAL LOW (ref >60)
GFR calc non Af Amer: 46 mL/min — ABNORMAL LOW (ref >60)
GLUCOSE: 224 mg/dL — AB (ref 65–99)
POTASSIUM: 3.2 mmol/L — AB (ref 3.5–5.1)
Sodium: 157 mmol/L — ABNORMAL HIGH (ref 135–145)

## 2017-03-31 LAB — SODIUM
Sodium: 154 mmol/L — ABNORMAL HIGH (ref 135–145)
Sodium: 153 mmol/L — ABNORMAL HIGH (ref 135–145)
Sodium: 145 mmol/L (ref 135–145)

## 2017-03-31 LAB — CBC
HEMATOCRIT: 20.9 % — AB (ref 39.0–52.0)
HEMOGLOBIN: 6.8 g/dL — AB (ref 13.0–17.0)
MCH: 28.6 pg (ref 26.0–34.0)
MCHC: 32.7 g/dL (ref 30.0–36.0)
MCV: 87.4 fL (ref 78.0–100.0)
Platelets: 144 10*3/uL — ABNORMAL LOW (ref 150–400)
RBC: 2.38 MIL/uL — ABNORMAL LOW (ref 4.22–5.81)
RDW: 15 % (ref 11.5–15.5)
WBC: 14.6 10*3/uL — ABNORMAL HIGH (ref 4.0–10.5)

## 2017-03-31 LAB — PREPARE RBC (CROSSMATCH)

## 2017-03-31 MED ORDER — INSULIN ASPART 100 UNIT/ML ~~LOC~~ SOLN
5.0000 [IU] | SUBCUTANEOUS | Status: DC
  Administered 2017-03-31 – 2017-04-01 (×6): 5 [IU] via SUBCUTANEOUS

## 2017-03-31 MED ORDER — SODIUM CHLORIDE 0.9 % IV SOLN
Freq: Once | INTRAVENOUS | Status: AC
  Administered 2017-03-31: 16:00:00 via INTRAVENOUS

## 2017-03-31 MED ORDER — POTASSIUM CHLORIDE 20 MEQ/15ML (10%) PO SOLN
20.0000 meq | Freq: Two times a day (BID) | ORAL | Status: DC
  Administered 2017-03-31 – 2017-04-03 (×6): 20 meq
  Filled 2017-03-31 (×6): qty 15

## 2017-03-31 MED ORDER — POTASSIUM CHLORIDE 10 MEQ/50ML IV SOLN
10.0000 meq | INTRAVENOUS | Status: AC
  Administered 2017-03-31 (×2): 10 meq via INTRAVENOUS
  Filled 2017-03-31 (×2): qty 50
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
## 2017-03-31 NOTE — Progress Notes (Signed)
CRITICAL VALUE ALERT  Critical Value:  Hgb: 6.8  Date & Time Notied:  0500 03/31/17  Provider Notified: Rameriz  Orders Received/Actions taken: No further actions taken, will address in the morning
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
## 2017-03-31 NOTE — Progress Notes (Addendum)
Inpatient Diabetes Program Recommendations  AACE/ADA: New Consensus Statement on Inpatient Glycemic Control (2015)  Target Ranges:  Prepandial:   less than 140 mg/dL      Peak postprandial:   less than 180 mg/dL (1-2 hours)      Critically ill patients:  140 - 180 mg/dL   Results for Leon Zhang, Leon Zhang (MRN [id number]) as of 03/31/2017 10:53  Ref. Range 03/30/2017 07:54 03/30/2017 12:21 03/30/2017 16:20 03/30/2017 20:08 03/30/2017 23:39 03/31/2017 03:39 03/31/2017 08:24  Glucose-Capillary Latest Ref Range: 65 - 99 mg/dL 782165 (H) 956204 (H) 213192 (H) 237 (H) 220 (H) 200 (H) 194 (H)   Review of Glycemic Control  Diabetes history: None Current orders for Inpatient glycemic control: Novolog Moderate 0-15 units Q4 hours  A1c 5.5%  Inpatient Diabetes Program Recommendations:    Patient also on Decadron 10 mg Q12 hours Glucose increased 190-230's since tube feeds started (Pivot 1.5 at 55 ml/hour). Please consider Novolog 5 units Q4 hour Tube Feed Coverage (hold if tube feeds are stopped or held).  Thanks,  Christena DeemShannon Romney Compean RN, MSN, [facility] Inpatient Diabetes Coordinator Team Pager [phone] (8a-5p)

## 2017-04-01 ENCOUNTER — Inpatient Hospital Stay (HOSPITAL_COMMUNITY): Payer: Self-pay

## 2017-04-01 LAB — CBC
HCT: 23 % — ABNORMAL LOW (ref 39.0–52.0)
HEMOGLOBIN: 7.7 g/dL — AB (ref 13.0–17.0)
MCH: 28.7 pg (ref 26.0–34.0)
MCHC: 33.5 g/dL (ref 30.0–36.0)
MCV: 85.8 fL (ref 78.0–100.0)
Platelets: 131 10*3/uL — ABNORMAL LOW (ref 150–400)
RBC: 2.68 MIL/uL — ABNORMAL LOW (ref 4.22–5.81)
RDW: 14.3 % (ref 11.5–15.5)
WBC: 14.2 10*3/uL — ABNORMAL HIGH (ref 4.0–10.5)

## 2017-04-01 LAB — GLUCOSE, CAPILLARY
GLUCOSE-CAPILLARY: 191 mg/dL — AB (ref 65–99)
Glucose-Capillary: 205 mg/dL — ABNORMAL HIGH (ref 65–99)
Glucose-Capillary: 196 mg/dL — ABNORMAL HIGH (ref 65–99)
Glucose-Capillary: 195 mg/dL — ABNORMAL HIGH (ref 65–99)
Glucose-Capillary: 204 mg/dL — ABNORMAL HIGH (ref 65–99)

## 2017-04-01 LAB — BASIC METABOLIC PANEL
Anion gap: 7 (ref 5–15)
BUN: 35 mg/dL — AB (ref 6–20)
CALCIUM: 8.2 mg/dL — AB (ref 8.9–10.3)
CHLORIDE: 117 mmol/L — AB (ref 101–111)
CO2: 25 mmol/L (ref 22–32)
CREATININE: 1.33 mg/dL — AB (ref 0.61–1.24)
GFR calc Af Amer: >60 mL/min (ref >60)
GFR calc non Af Amer: >60 mL/min (ref >60)
GLUCOSE: 205 mg/dL — AB (ref 65–99)
Potassium: 3.5 mmol/L (ref 3.5–5.1)
Sodium: 149 mmol/L — ABNORMAL HIGH (ref 135–145)

## 2017-04-01 LAB — SODIUM
SODIUM: 141 mmol/L (ref 135–145)
Sodium: 146 mmol/L — ABNORMAL HIGH (ref 135–145)
Sodium: 138 mmol/L (ref 135–145)

## 2017-04-01 MED ORDER — BETHANECHOL 1 MG/ML PEDIATRIC ORAL SUSPENSION
10.0000 mg | Freq: Three times a day (TID) | ORAL | Status: DC
  Filled 2017-04-01: qty 10

## 2017-04-01 MED ORDER — BETHANECHOL CHLORIDE 10 MG PO TABS
10.0000 mg | ORAL_TABLET | Freq: Three times a day (TID) | ORAL | Status: DC
  Administered 2017-04-01 – 2017-04-03 (×6): 10 mg
  Filled 2017-04-01 (×6): qty 1

## 2017-04-01 MED ORDER — INSULIN GLARGINE 100 UNIT/ML ~~LOC~~ SOLN
10.0000 [IU] | Freq: Every day | SUBCUTANEOUS | Status: DC
  Administered 2017-04-01 – 2017-04-02 (×2): 10 [IU] via SUBCUTANEOUS
  Filled 2017-04-01 (×3): qty 0.1
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
## 2017-04-02 ENCOUNTER — Encounter (HOSPITAL_COMMUNITY): Payer: Self-pay

## 2017-04-02 LAB — BASIC METABOLIC PANEL
ANION GAP: 5 (ref 5–15)
BUN: 32 mg/dL — AB (ref 6–20)
CALCIUM: 8.5 mg/dL — AB (ref 8.9–10.3)
CO2: 23 mmol/L (ref 22–32)
Chloride: 108 mmol/L (ref 101–111)
Creatinine, Ser: 0.98 mg/dL (ref 0.61–1.24)
GFR calc Af Amer: >60 mL/min (ref >60)
Glucose, Bld: 203 mg/dL — ABNORMAL HIGH (ref 65–99)
POTASSIUM: 3.8 mmol/L (ref 3.5–5.1)
SODIUM: 136 mmol/L (ref 135–145)

## 2017-04-02 LAB — BPAM RBC
BLOOD PRODUCT EXPIRATION DATE: 201811082359
Blood Product Expiration Date: 201811092359
ISSUE DATE / TIME: 201810171543
Unit Type and Rh: 6200
Unit Type and Rh: 6200

## 2017-04-02 LAB — CBC WITH DIFFERENTIAL/PLATELET
BASOS ABS: 0 10*3/uL (ref 0.0–0.1)
Basophils Relative: 0 %
EOS ABS: 0 10*3/uL (ref 0.0–0.7)
EOS PCT: 0 %
HCT: 24.8 % — ABNORMAL LOW (ref 39.0–52.0)
Hemoglobin: 8.3 g/dL — ABNORMAL LOW (ref 13.0–17.0)
Lymphocytes Relative: 9 %
Lymphs Abs: 1.2 10*3/uL (ref 0.7–4.0)
MCH: 28.4 pg (ref 26.0–34.0)
MCHC: 33.5 g/dL (ref 30.0–36.0)
MCV: 84.9 fL (ref 78.0–100.0)
Monocytes Absolute: 1 10*3/uL (ref 0.1–1.0)
Monocytes Relative: 7 %
Neutro Abs: 12.2 10*3/uL — ABNORMAL HIGH (ref 1.7–7.7)
Neutrophils Relative %: 84 %
PLATELETS: 147 10*3/uL — AB (ref 150–400)
RBC: 2.92 MIL/uL — AB (ref 4.22–5.81)
RDW: 13.8 % (ref 11.5–15.5)
WBC: 14.4 10*3/uL — AB (ref 4.0–10.5)

## 2017-04-02 LAB — GLUCOSE, CAPILLARY
GLUCOSE-CAPILLARY: 203 mg/dL — AB (ref 65–99)
Glucose-Capillary: 131 mg/dL — ABNORMAL HIGH (ref 65–99)
Glucose-Capillary: 144 mg/dL — ABNORMAL HIGH (ref 65–99)
Glucose-Capillary: 157 mg/dL — ABNORMAL HIGH (ref 65–99)
Glucose-Capillary: 197 mg/dL — ABNORMAL HIGH (ref 65–99)
Glucose-Capillary: 211 mg/dL — ABNORMAL HIGH (ref 65–99)
Glucose-Capillary: 237 mg/dL — ABNORMAL HIGH (ref 65–99)

## 2017-04-02 LAB — TYPE AND SCREEN
ABO/RH(D): A POS
Antibody Screen: NEGATIVE
Unit division: 0
Unit division: 0

## 2017-04-02 LAB — SODIUM: Sodium: 133 mmol/L — ABNORMAL LOW (ref 135–145)

## 2017-04-02 MED ORDER — CHLORHEXIDINE GLUCONATE CLOTH 2 % EX PADS
6.0000 | MEDICATED_PAD | Freq: Every day | CUTANEOUS | Status: DC
  Administered 2017-04-02: 6 via TOPICAL

## 2017-04-02 MED ORDER — SODIUM CHLORIDE 0.9 % IV SOLN
25.0000 mg | Freq: Four times a day (QID) | INTRAVENOUS | Status: DC | PRN
  Administered 2017-04-02 – 2017-04-03 (×2): 25 mg via INTRAVENOUS
  Filled 2017-04-02 (×3): qty 1

## 2017-04-02 MED ORDER — POTASSIUM CHLORIDE IN NACL 20-0.45 MEQ/L-% IV SOLN
INTRAVENOUS | Status: DC
  Administered 2017-04-02 – 2017-04-03 (×2): via INTRAVENOUS
  Filled 2017-04-02 (×2): qty 1000

## 2017-04-02 MED ORDER — CHLORHEXIDINE GLUCONATE CLOTH 2 % EX PADS: 6.0000 | MEDICATED_PAD | Freq: Every day | CUTANEOUS | Status: DC

## 2017-04-02 MED ORDER — CHLORHEXIDINE GLUCONATE 0.12 % MT SOLN
OROMUCOSAL | Status: AC
  Administered 2017-04-02: 15 mL via OROMUCOSAL
  Filled 2017-04-02: qty 15

## 2017-04-02 NOTE — Progress Notes (Signed)
Follow up - Trauma and Critical Care  Patient Details:    RIORDAN WALLE is an 36 y.o. male.  Lines/tubes : Airway 8 mm (Active)  Secured at (cm) 22 cm 04/02/2017 11:34 AM  Measured From Lips 04/02/2017 11:34 AM  Secured Location Right 04/02/2017 11:34 AM  Secured By [employer] 04/02/2017 11:34 AM  Tube Holder Repositioned Yes 04/02/2017 11:34 AM  Cuff Pressure (cm H2O) 28 cm H2O 03/31/2017  3:26 PM  Site Condition Dry 04/02/2017 11:34 AM     CVC Triple Lumen 03/28/17 Right Internal jugular (Active)  Indication for Insertion or Continuance of Line Prolonged intravenous therapies;Poor Vasculature-patient has had multiple peripheral attempts or PIVs lasting less than 24 hours 04/02/2017  8:00 AM  Site Assessment Clean;Dry;Intact 04/02/2017  8:00 AM  Proximal Lumen Status Saline locked;Flushed 04/02/2017  8:00 AM  Medial Lumen Status Infusing 04/02/2017  8:00 AM  Distal Lumen Status Flushed;In-line blood sampling system in place 04/02/2017  8:00 AM  Dressing Type Transparent;Occlusive 04/02/2017  8:00 AM  Dressing Status Clean;Dry;Intact;Antimicrobial disc in place 04/02/2017  8:00 AM  Line Care Connections checked and tightened 04/02/2017  8:00 AM  Dressing Intervention New dressing 04/02/2017  5:00 AM  Dressing Change Due 04/09/17 04/02/2017  8:00 AM     NG/OG Tube Orogastric 18 Fr. Center mouth Xray (Active)  External Length of Tube (cm) - (if applicable) 58 cm 04/02/2017  8:00 AM  Site Assessment Clean;Dry;Intact 04/02/2017  8:00 AM  Ongoing Placement Verification No change in cm markings or external length of tube from initial placement;No change in respiratory status;No acute changes, not attributed to clinical condition 04/02/2017  8:00 AM  Status Infusing tube feed 04/02/2017  8:00 AM  Drainage Appearance Bloody;Brown 03/28/2017  8:00 AM  Intake (mL) 100 mL 04/01/2017 10:00 AM     Urethral Catheter Tramaine, EMT Double-lumen;Latex 16 Fr. (Active)  Indication  for Insertion or Continuance of Catheter Other (comment) 04/02/2017  8:00 AM  Site Assessment Clean;Intact 04/02/2017  8:00 AM  Catheter Maintenance Bag below level of bladder;Catheter secured;Drainage bag/tubing not touching floor;Insertion date on drainage bag;No dependent loops;Seal intact;Bag emptied prior to transport 04/02/2017  8:00 AM  Collection Container Standard drainage bag 04/02/2017  8:00 AM  Securement Method Securing device (Describe) 04/02/2017  8:00 AM  Urinary Catheter Interventions Unclamped 04/02/2017  4:00 AM  Output (mL) 125 mL 04/02/2017 12:00 PM    Microbiology/Sepsis markers: Results for orders placed or performed during the hospital encounter of 04/21/17  MRSA PCR Screening     Status: None   Collection Time: 03/29/17  9:24 AM  Result Value Ref Range Status   MRSA by PCR NEGATIVE NEGATIVE Final    Comment:        The GeneXpert MRSA Assay (FDA approved for NASAL specimens only), is one component of a comprehensive MRSA colonization surveillance program. It is not intended to diagnose MRSA infection nor to guide or monitor treatment for MRSA infections.   Culture, respiratory (NON-Expectorated)     Status: None (Preliminary result)   Collection Time: 04/01/17  9:56 AM  Result Value Ref Range Status   Specimen Description TRACHEAL ASPIRATE  Final   Special Requests Normal  Final   Gram Stain   Final    MODERATE WBC PRESENT, PREDOMINANTLY PMN ABUNDANT GRAM NEGATIVE RODS FEW GRAM POSITIVE COCCI    Culture ABUNDANT GRAM NEGATIVE RODS  Final   Report Status PENDING  Incomplete    Anti-infectives:  Anti-infectives    None  Best Practice/Protocols:  VTE Prophylaxis: Mechanical GI Prophylaxis: Proton Pump Inhibitor Intermittent Sedation  Consults: Treatment Team:  Tia AlertJones, [name], MD    Events:  Subjective:    Overnight Issues: No new issues overnight.  Objective:  Vital signs for last 24 hours: Temp:  [98.2 F (36.8 C)-102 F  (38.9 C)] 99.5 F (37.5 C) (10/19 1400) Pulse Rate:  [59-136] 124 (10/19 1400) Resp:  [16-35] 25 (10/19 1400) BP: (98-169)/(53-95) 114/77 (10/19 1400) SpO2:  [91 %-100 %] 95 % (10/19 1400) FiO2 (%):  [30 %] 30 % (10/19 1134) Weight:  [84 kg (185 lb 3 oz)] 84 kg (185 lb 3 oz) (10/19 0349)  Hemodynamic parameters for last 24 hours:    Intake/Output from previous day: 10/18 0701 - 10/19 0700 In: 4670 [I.V.:2650; NG/GT:2020] Out: 3565 [Urine:3565]  Intake/Output this shift: Total I/O In: 825 [I.V.:770; NG/GT:55] Out: 725 [Urine:725]  Vent settings for last 24 hours: Vent Mode: PRVC FiO2 (%):  [30 %] 30 % Set Rate:  [16 bmp] 16 bmp Vt Set:  [620 mL] 620 mL PEEP:  [5 cmH20] 5 cmH20 Plateau Pressure:  [14 cmH20-22 cmH20] 22 cmH20  Physical Exam:  General: no respiratory distress and Will get tachypneic with stimulation. Neuro: nonfocal exam, RASS -2, weakness right upper extremity, weakness right lower extremity, weakness left upper extremity and weakness left lower extremity Resp: clear to auscultation bilaterally CVS: regular rate and rhythm, S1, S2 normal, no murmur, click, rub or gallop GI: soft, nontender, BS WNL, no r/g and tube feedings off for now. Extremities: no edema, no erythema, pulses WNL  Results for orders placed or performed during the hospital encounter of 03/20/2017 (from the past 24 hour(s))  Glucose, capillary     Status: Abnormal   Collection Time: 04/01/17  4:54 PM  Result Value Ref Range   Glucose-Capillary 195 (H) 65 - 99 mg/dL  Sodium     Status: None   Collection Time: 04/01/17  4:57 PM  Result Value Ref Range   Sodium 141 135 - 145 mmol/L  Glucose, capillary     Status: Abnormal   Collection Time: 04/01/17  7:53 PM  Result Value Ref Range   Glucose-Capillary 204 (H) 65 - 99 mg/dL   Comment 1 Notify RN    Comment 2 Document in Chart   Sodium     Status: None   Collection Time: 04/01/17  9:33 PM  Result Value Ref Range   Sodium 138 135 - 145  mmol/L  Glucose, capillary     Status: Abnormal   Collection Time: 04/02/17 12:06 AM  Result Value Ref Range   Glucose-Capillary 237 (H) 65 - 99 mg/dL   Comment 1 Notify RN    Comment 2 Document in Chart   Glucose, capillary     Status: Abnormal   Collection Time: 04/02/17  3:24 AM  Result Value Ref Range   Glucose-Capillary 211 (H) 65 - 99 mg/dL   Comment 1 Notify RN    Comment 2 Document in Chart   CBC with Differential/Platelet     Status: Abnormal   Collection Time: 04/02/17  3:27 AM  Result Value Ref Range   WBC 14.4 (H) 4.0 - 10.5 K/uL   RBC 2.92 (L) 4.22 - 5.81 MIL/uL   Hemoglobin 8.3 (L) 13.0 - 17.0 g/dL   HCT 16.124.8 (L) 09.639.0 - 04.552.0 %   MCV 84.9 78.0 - 100.0 fL   MCH 28.4 26.0 - 34.0 pg   MCHC 33.5 30.0 - 36.0  g/dL   RDW 16.1 09.6 - 04.5 %   Platelets 147 (L) 150 - 400 K/uL   Neutrophils Relative % 84 %   Neutro Abs 12.2 (H) 1.7 - 7.7 K/uL   Lymphocytes Relative 9 %   Lymphs Abs 1.2 0.7 - 4.0 K/uL   Monocytes Relative 7 %   Monocytes Absolute 1.0 0.1 - 1.0 K/uL   Eosinophils Relative 0 %   Eosinophils Absolute 0.0 0.0 - 0.7 K/uL   Basophils Relative 0 %   Basophils Absolute 0.0 0.0 - 0.1 K/uL  Basic metabolic panel     Status: Abnormal   Collection Time: 04/02/17  3:27 AM  Result Value Ref Range   Sodium 136 135 - 145 mmol/L   Potassium 3.8 3.5 - 5.1 mmol/L   Chloride 108 101 - 111 mmol/L   CO2 23 22 - 32 mmol/L   Glucose, Bld 203 (H) 65 - 99 mg/dL   BUN 32 (H) 6 - 20 mg/dL   Creatinine, Ser 4.09 0.61 - 1.24 mg/dL   Calcium 8.5 (L) 8.9 - 10.3 mg/dL   GFR calc non Af Amer >60 >60 mL/min   GFR calc Af Amer >60 >60 mL/min   Anion gap 5 5 - 15  Glucose, capillary     Status: Abnormal   Collection Time: 04/02/17  7:38 AM  Result Value Ref Range   Glucose-Capillary 203 (H) 65 - 99 mg/dL  Sodium     Status: Abnormal   Collection Time: 04/02/17  8:38 AM  Result Value Ref Range   Sodium 133 (L) 135 - 145 mmol/L  Glucose, capillary     Status: Abnormal    Collection Time: 04/02/17 11:28 AM  Result Value Ref Range   Glucose-Capillary 144 (H) 65 - 99 mg/dL     Assessment/Plan:   NEURO  Altered Mental Status:  obtundation and coma   Plan: No purposeful movement currently.  No movement with stimulation.  PULM  No current issues   Plan: CPM  CARDIO  No issues   Plan: CPM  RENAL  Urine output is good.  Renal function is good.   Plan: CPM  GI  No specific issues   Plan: CPM  ID  No known infecitous sources   Plan: CPM  HEME  Anemia acute blood loss anemia and anemia of critical illness)   Plan: No transfusion necessary.  ENDO DI resolved and hypernatremia resolved   Plan: Change IVFs  Global Issues  Long family conference to discuss long term care and planning.  The father stated that he is agreeable to do whatever the wife wanted to do.  She was undecided.  The idea of DNR status was discussed and specifically told this would not affect continued care.  Also discussed the idea of withdrawal of support, and it seems as though the family would like to wait at least a week before considering withdrawal--so we will likely be performing a trach and PEG early next week.  Discussed also placement after trach and PEG which could be locally or in [place] if off the vent, but not nearby if on the ventilator.    LOS: 6 days   Additional comments:I reviewed the patient's new clinical lab test results. cbc/bmet, I reviewed the patients new imaging test results. CXR, I personally viewed and interpreted the patient's ventilator data and vital signs. and I have discussed and reviewed with family members patient's entire family in conference to discuss long term care  Critical Care Total  Time*: 1 Hour 30 Minutes  Floyd Lusignan 04/02/2017  *Care during the described time interval was provided by me and/or other providers on the critical care team.  I have reviewed this patient's available data, including medical history, events of note, physical  examination and test results as part of my evaluation.

## 2017-04-02 NOTE — Progress Notes (Signed)
RN spoke with CDS representative, Elijah Birkom F., to give an update as to the outcome of the family meeting that took place earlier today.  Family will take the weekend to contemplate over the best option for the pt. and their family. RN will continue to provide support and keep the health care providers/team informed.

## 2017-04-02 NOTE — Progress Notes (Signed)
Pt had an episode of emesis this morning. TF held. Ondansetron given IV. No change in respiratory rate, LS unchanged. MD made aware. Orders for new medication received. Will continue to assess and monitor closely.

## 2017-04-02 NOTE — Progress Notes (Signed)
NEUROSURGERY PROGRESS NOTE  [facility] With[facility] to painful stimulus.   Temp:  [98.2 F (36.8 C)-102 F (38.9 C)] 99 F (37.2 C) (10/19 0600) Pulse Rate:  [59-108] 67 (10/19 0600) Resp:  [21-34] 21 (10/19 0600) BP: (118-155)/(69-90) 123/90 (10/19 0600) SpO2:  [95 %-100 %] 100 % (10/19 0600) FiO2 (%):  [30 %-40 %] 30 % (10/19 0336) Weight:  [84 kg (185 lb 3 oz)] 84 kg (185 lb 3 oz) (10/19 0349)  Plan: Per case management, family meeting today to discuss goals of care. No new nsgy recom.   Sherryl MangesKimberly Hannah Markella Dao, NP 04/02/2017 7:09 AM

## 2017-04-02 NOTE — Progress Notes (Signed)
Turned up to 60% due to a drop in sats. Patient was 80 on 30%. Will titrate down as tolerated.

## 2017-04-03 ENCOUNTER — Encounter (HOSPITAL_COMMUNITY): Payer: Self-pay

## 2017-04-03 ENCOUNTER — Inpatient Hospital Stay (HOSPITAL_COMMUNITY): Payer: Self-pay

## 2017-04-03 LAB — CBC WITH DIFFERENTIAL/PLATELET
BASOS PCT: 0 %
Basophils Absolute: 0 10*3/uL (ref 0.0–0.1)
EOS ABS: 0 10*3/uL (ref 0.0–0.7)
EOS PCT: 0 %
HEMATOCRIT: 32.4 % — AB (ref 39.0–52.0)
Hemoglobin: 11.1 g/dL — ABNORMAL LOW (ref 13.0–17.0)
LYMPHS PCT: 4 %
Lymphs Abs: 1 10*3/uL (ref 0.7–4.0)
MCH: 28.8 pg (ref 26.0–34.0)
MCHC: 34.3 g/dL (ref 30.0–36.0)
MCV: 84.2 fL (ref 78.0–100.0)
Monocytes Absolute: 1 10*3/uL (ref 0.1–1.0)
Monocytes Relative: 4 %
NEUTROS ABS: 23.4 10*3/uL — AB (ref 1.7–7.7)
NEUTROS PCT: 92 %
Platelets: 188 10*3/uL (ref 150–400)
RBC: 3.85 MIL/uL — ABNORMAL LOW (ref 4.22–5.81)
RDW: 13.4 % (ref 11.5–15.5)
WBC: 25.4 10*3/uL — ABNORMAL HIGH (ref 4.0–10.5)

## 2017-04-03 LAB — BASIC METABOLIC PANEL
ANION GAP: 8 (ref 5–15)
BUN: 31 mg/dL — AB (ref 6–20)
CHLORIDE: 104 mmol/L (ref 101–111)
CO2: 20 mmol/L — AB (ref 22–32)
Calcium: 8.3 mg/dL — ABNORMAL LOW (ref 8.9–10.3)
Creatinine, Ser: 1.01 mg/dL (ref 0.61–1.24)
GFR calc Af Amer: >60 mL/min (ref >60)
GLUCOSE: 135 mg/dL — AB (ref 65–99)
POTASSIUM: 4.5 mmol/L (ref 3.5–5.1)
Sodium: 132 mmol/L — ABNORMAL LOW (ref 135–145)

## 2017-04-03 LAB — STREP PNEUMONIAE URINARY ANTIGEN: STREP PNEUMO URINARY ANTIGEN: NEGATIVE

## 2017-04-03 LAB — GLUCOSE, CAPILLARY
GLUCOSE-CAPILLARY: 134 mg/dL — AB (ref 65–99)
GLUCOSE-CAPILLARY: 152 mg/dL — AB (ref 65–99)
Glucose-Capillary: 114 mg/dL — ABNORMAL HIGH (ref 65–99)
Glucose-Capillary: 192 mg/dL — ABNORMAL HIGH (ref 65–99)

## 2017-04-03 MED ORDER — LEVOFLOXACIN IN D5W 750 MG/150ML IV SOLN
750.0000 mg | INTRAVENOUS | Status: DC
  Administered 2017-04-03: 750 mg via INTRAVENOUS
  Filled 2017-04-03: qty 150

## 2017-04-03 MED ORDER — MIDAZOLAM BOLUS VIA INFUSION
1.0000 mg | INTRAVENOUS | Status: DC | PRN
  Filled 2017-04-03: qty 2

## 2017-04-03 MED ORDER — FENTANYL BOLUS VIA INFUSION
50.0000 ug | INTRAVENOUS | Status: DC | PRN
  Filled 2017-04-03: qty 50

## 2017-04-03 MED ORDER — SODIUM CHLORIDE 0.9 % IV BOLUS (SEPSIS)
1000.0000 mL | Freq: Once | INTRAVENOUS | Status: AC
  Administered 2017-04-03: 1000 mL via INTRAVENOUS

## 2017-04-03 MED ORDER — VANCOMYCIN HCL IN DEXTROSE 1-5 GM/200ML-% IV SOLN
1000.0000 mg | Freq: Three times a day (TID) | INTRAVENOUS | Status: DC
  Filled 2017-04-03 (×2): qty 200

## 2017-04-03 MED ORDER — SODIUM CHLORIDE 0.9 % IV SOLN
0.0000 mg/h | INTRAVENOUS | Status: DC
  Filled 2017-04-03: qty 10

## 2017-04-03 MED ORDER — SODIUM CHLORIDE 0.9 % IV SOLN
INTRAVENOUS | Status: DC
  Administered 2017-04-03: 11:00:00 via INTRAVENOUS

## 2017-04-03 MED ORDER — FENTANYL 2500MCG IN NS 250ML (10MCG/ML) PREMIX INFUSION
25.0000 ug/h | INTRAVENOUS | Status: DC
  Administered 2017-04-03: 50 ug/h via INTRAVENOUS
  Filled 2017-04-03: qty 250

## 2017-04-03 MED ORDER — FENTANYL CITRATE (PF) 100 MCG/2ML IJ SOLN
50.0000 ug | Freq: Once | INTRAMUSCULAR | Status: AC
  Administered 2017-04-03: 50 ug via INTRAVENOUS
  Filled 2017-04-03: qty 2

## 2017-04-03 MED ORDER — SODIUM CHLORIDE 0.9 % IV SOLN
1500.0000 mg | Freq: Once | INTRAVENOUS | Status: AC
  Administered 2017-04-03: 1500 mg via INTRAVENOUS
  Filled 2017-04-03: qty 1500

## 2017-04-04 LAB — CULTURE, RESPIRATORY W GRAM STAIN

## 2017-04-04 LAB — CULTURE, RESPIRATORY: SPECIAL REQUESTS: NORMAL

## 2017-04-04 NOTE — Progress Notes (Signed)
Around 200ml Fentanyl wasted with Geographical information systems officerLogan RN. Versed gtt unused and returned to pharmacy. CDS notified. All lines/tubes left in place.

## 2017-04-05 LAB — GLUCOSE, CAPILLARY: GLUCOSE-CAPILLARY: 162 mg/dL — AB (ref 65–99)

## 2017-04-08 LAB — CULTURE, BLOOD (ROUTINE X 2)
CULTURE: NO GROWTH
Culture: NO GROWTH
SPECIAL REQUESTS: ADEQUATE
SPECIAL REQUESTS: ADEQUATE
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
## 2017-04-15 NOTE — Progress Notes (Signed)
Called [facility]S to update with pt condition (GCS 3, decreasing O2 sats, DNR status, comfort care). [facility]S sent representative to sight. Unable to move forward; pt not a candidate. ME aware.

## 2017-04-15 NOTE — Progress Notes (Signed)
On arrival noted patient having irregular respirations resembling cheyne-stroke respirations over the vent. Pt respirations are elevated in the 40's with intermittent pauses of respirations over the vent. Per desaturation patient was manually ventilated 100% bagged lavage. Pt was suctioned got back copious amount of thick tan secretions/mucous plugs. Pt remains on 100% O2 due to desaturations. Pt is tachycardic in the 170's, pt blood pressure is soft.

## 2017-04-15 NOTE — Progress Notes (Signed)
Pt desat and tachycardic, pressures dropped to 70s/40s MD Blackmon paged. New orders for CXR and 1L bolus of saline.

## 2017-04-15 NOTE — Progress Notes (Signed)
Patient ID: Merrily Pewerry JR XXXShore, male   DOB: 1980-11-08, 36 y.o.   MRN: [id number] BP (!) 87/41   Pulse (!) 36   Temp 97.7 F (36.5 C) (Rectal)   Resp (!) 36   Ht 5\' 8"  (1.727 m)   Wt 84.4 kg (186 lb 1.1 oz)   SpO2 96%   BMI 28.29 kg/m  No change in exam. No new recommendations
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
## 2017-04-15 NOTE — Progress Notes (Signed)
2125: Pt noted to have slowing heart rate. Family updated. 2129: Asystole noted on the monitor. No heartbeat auscultated. Family updated. Chaplain called. TOD called 2129.

## 2017-04-15 NOTE — Progress Notes (Signed)
Family has reached consensus that they would like to move forward with DO NOT RESUSCITATE. We will follow with their wishes.
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
## 2017-04-15 NOTE — Progress Notes (Signed)
Many family members at bedside at this time. Ventilator check will be completed when most family leaves.

## 2017-04-15 DEATH — deceased

## 2018-11-24 IMAGING — CR DG CHEST 1V PORT
1 series · 1 of 1 positions shown · non-contrast
Comparison: Yesterday

CLINICAL DATA: Increased tracheal secretions

EXAM:
PORTABLE CHEST 1 VIEW

[AP]
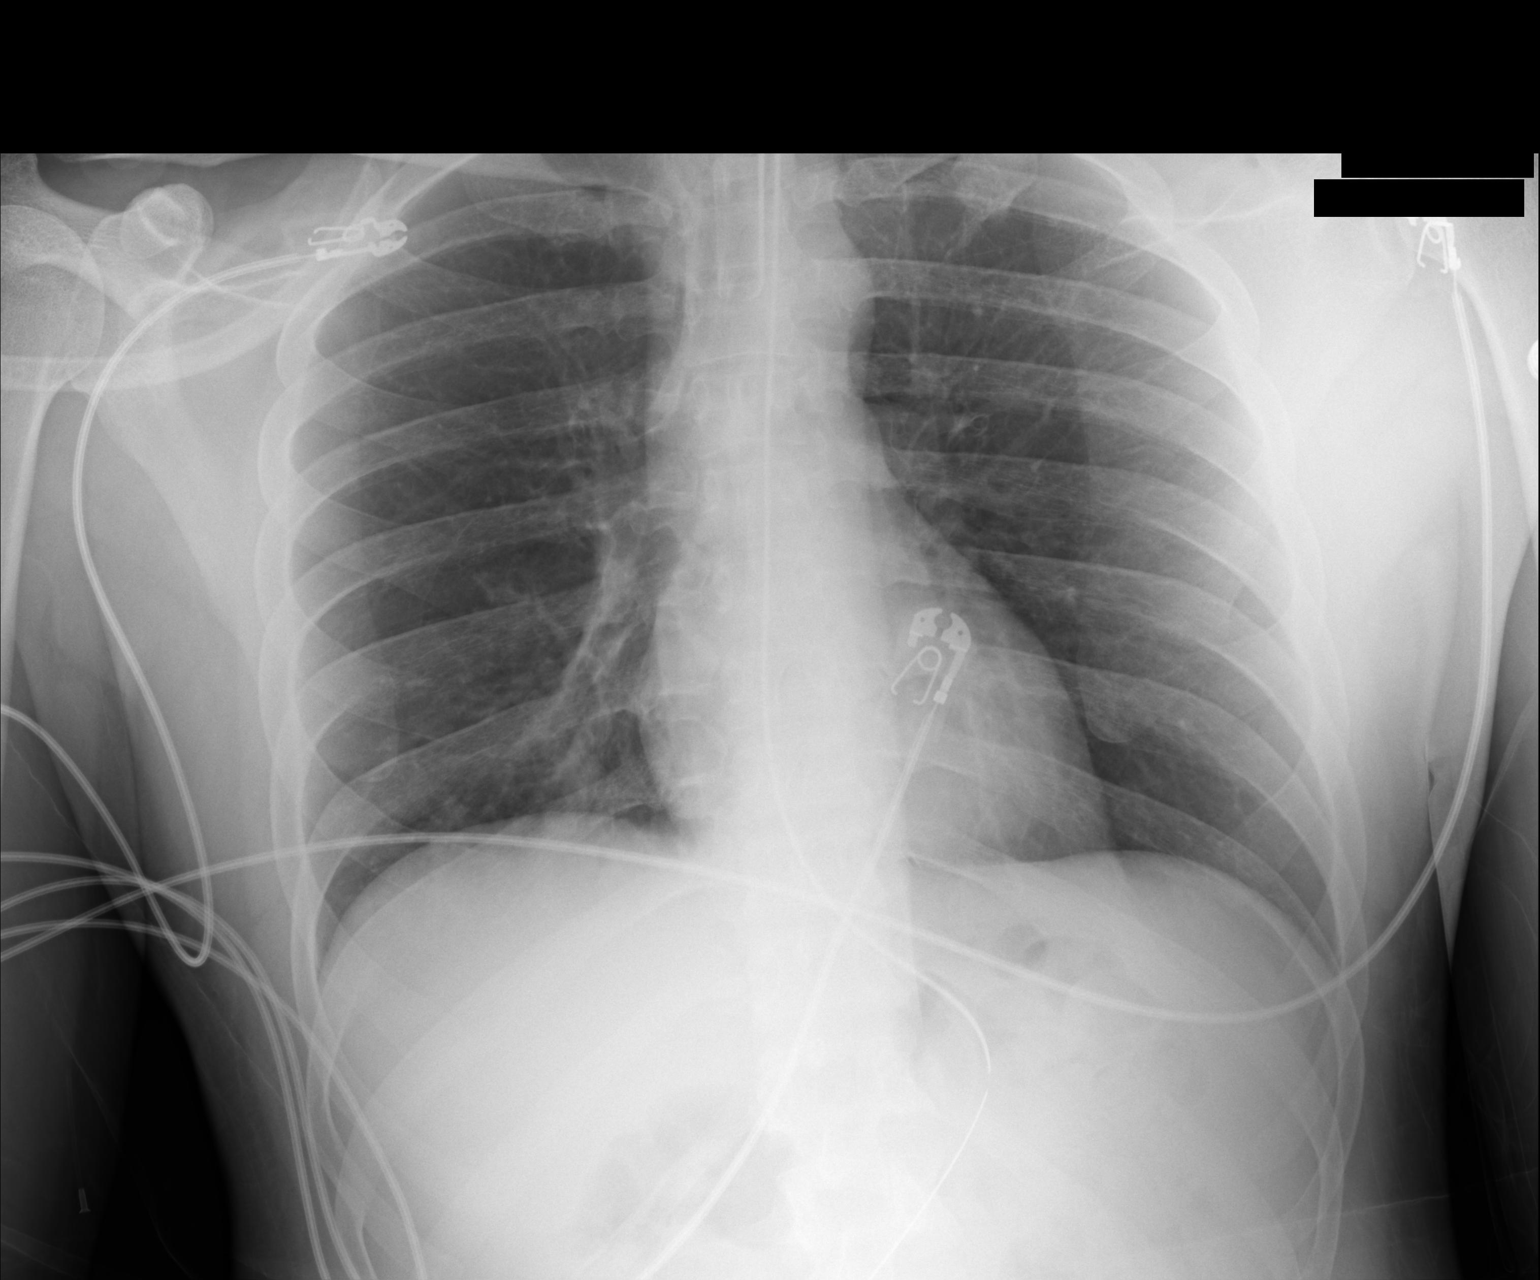

[1 of 1 positions shown; findings below may reference images not displayed]

FINDINGS: Endotracheal tube tip is just below the clavicular heads. An
orogastric tube reaches the stomach. Streaky opacity at the right
base that is likely atelectasis in this setting. Right diaphragm has
normalized in position compared to prior. No edema, effusion, or
pneumothorax.
IMPRESSION: 1. Stable positioning of endotracheal and orogastric tubes.
2. Improved aeration at the right base, although there is still
right infrahilar atelectasis.
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
